# Patient Record
Sex: Male | Born: 1965 | Hispanic: Refuse to answer
Health system: Southern US, Community
[De-identification: ages and names within clinical notes are randomized; demographics above are authoritative.]

## PROBLEM LIST (undated history)

## (undated) DIAGNOSIS — G4730 Sleep apnea, unspecified: Secondary | ICD-10-CM

## (undated) HISTORY — DX: Sleep apnea, unspecified: G47.30

---

## 2008-03-02 ENCOUNTER — Encounter: Payer: Self-pay | Admitting: Pulmonary Disease

## 2008-03-16 ENCOUNTER — Ambulatory Visit: Payer: Self-pay | Admitting: Pulmonary Disease

## 2008-03-16 DIAGNOSIS — G4730 Sleep apnea, unspecified: Secondary | ICD-10-CM | POA: Insufficient documentation

## 2008-03-16 DIAGNOSIS — E669 Obesity, unspecified: Secondary | ICD-10-CM

## 2008-03-20 ENCOUNTER — Ambulatory Visit (HOSPITAL_BASED_OUTPATIENT_CLINIC_OR_DEPARTMENT_OTHER): Admission: RE | Admit: 2008-03-20 | Discharge: 2008-03-20 | Payer: Self-pay | Admitting: Pulmonary Disease

## 2008-03-20 ENCOUNTER — Encounter: Payer: Self-pay | Admitting: Pulmonary Disease

## 2008-03-21 ENCOUNTER — Ambulatory Visit: Payer: Self-pay | Admitting: Pulmonary Disease

## 2008-03-21 ENCOUNTER — Telehealth: Payer: Self-pay | Admitting: Pulmonary Disease

## 2008-03-27 ENCOUNTER — Encounter: Payer: Self-pay | Admitting: Pulmonary Disease

## 2008-04-09 ENCOUNTER — Encounter: Payer: Self-pay | Admitting: Pulmonary Disease

## 2008-04-24 ENCOUNTER — Ambulatory Visit: Payer: Self-pay | Admitting: Pulmonary Disease

## 2008-06-25 ENCOUNTER — Ambulatory Visit: Payer: Self-pay | Admitting: Pulmonary Disease

## 2008-10-02 ENCOUNTER — Encounter: Payer: Self-pay | Admitting: Pulmonary Disease

## 2008-10-08 ENCOUNTER — Telehealth: Payer: Self-pay | Admitting: Pulmonary Disease

## 2008-11-27 ENCOUNTER — Ambulatory Visit: Payer: Self-pay | Admitting: Pulmonary Disease

## 2009-12-16 ENCOUNTER — Ambulatory Visit: Payer: Self-pay | Admitting: Pulmonary Disease

## 2009-12-24 ENCOUNTER — Encounter: Payer: Self-pay | Admitting: Pulmonary Disease

## 2010-02-21 ENCOUNTER — Encounter: Payer: Self-pay | Admitting: Pulmonary Disease

## 2011-01-27 NOTE — Letter (Signed)
Summary: [facility] Results Follow Up Letter  [facility]  [address]   [place], [place] [address]   Phone: [phone]  Fax: [phone]    02/21/2010 MRN: [id number]  Julian Dixon [address] [place], [place]  [address]  Dear [name],  We have received the results from your recent tests and have been unable to contact you.  Please call our office at [phone] so that [facility] or his nurse may review the results with you.    Thank you,  [facility]  Appended Document: [facility] Results Follow Up Letter Letter mailed due to several attempts to contact pt via telephone. Need to inform of following- avg AHI 9/h, pl ask him to increase usage 4-6 hrs/ night. jwr

## 2011-02-11 ENCOUNTER — Ambulatory Visit: Payer: Self-pay | Admitting: Pulmonary Disease

## 2011-02-12 ENCOUNTER — Telehealth: Payer: Self-pay | Admitting: Pulmonary Disease

## 2011-02-16 ENCOUNTER — Encounter: Payer: Self-pay | Admitting: Pulmonary Disease

## 2011-02-16 ENCOUNTER — Ambulatory Visit (INDEPENDENT_AMBULATORY_CARE_PROVIDER_SITE_OTHER): Payer: Managed Care, Other (non HMO) | Admitting: Pulmonary Disease

## 2011-02-16 DIAGNOSIS — G4730 Sleep apnea, unspecified: Secondary | ICD-10-CM

## 2011-02-16 DIAGNOSIS — E669 Obesity, unspecified: Secondary | ICD-10-CM

## 2011-02-18 NOTE — Progress Notes (Signed)
Summary: nos appt  Phone Note Call from Patient   Caller: [email or web address]  Call For: Zebulon Gantt Summary of Call: Rsc nos from 2/16 to 2/20. Initial call taken by: Darletta Moll,  February 12, 2011 3:07 PM

## 2011-02-24 NOTE — Assessment & Plan Note (Signed)
Summary: wheezing//jd   Visit Type:  Follow-up Primary Provider/Referring Provider:  Dr. Lerry Liner ([facility])  CC:  Pt c/o wheezing x 2 1/2 wheezing all the time.  History of Present Illness: 44/M with severe obstructive sleep apnea for FU 4/09 >>Severe OSA AHI 83/h corrected by BiPAP +25/18 , full face mask. set up with BiPAP 04/09/08 at 23/18.  12/09>>mask ok, some dryness, energy levels 'great' , download 5/28- 9/28 >> need to improve compliance  December 16, 2009 2:54 PM  annual FU, some leak +, gained 20 lbs download 11/29-12/28/10 >> BiPAP 20/15, avg AHI 9/h, poor compliance  February 16, 2011 10:16 AM  Not helping, not using it right, he waits until asleep to put on machine WIfe now c/o wheezing on exertion & at rest indoors or outdoors, c/o excessive daytime somnolence  - has fallen asleep while drivnig , she 'has it on video' Has lost 18 lbs Spirometry shows nml ratio but FEv1 of 75%  - favor restriction rathe than obstruction   Preventive Screening-Counseling & Management  Alcohol-Tobacco     Smoking Status: never  Current Medications (verified): 1)  Bipap 23/18  Allergies (verified): No Known Drug Allergies  Past History:  Past Medical History: Last updated: 03/16/2008 Sleep Apnea  Social History: Last updated: 03/16/2008 divorced no children  lives alone  Review of Systems       The patient complains of dyspnea on exertion.  The patient denies anorexia, fever, weight loss, weight gain, vision loss, decreased hearing, hoarseness, chest pain, syncope, peripheral edema, prolonged cough, headaches, hemoptysis, abdominal pain, melena, hematochezia, severe indigestion/heartburn, hematuria, muscle weakness, suspicious skin lesions, transient blindness, difficulty walking, depression, unusual weight change, abnormal bleeding, enlarged lymph nodes, and angioedema.    Vital Signs:  Patient profile:   45 year old male Height:      74  inches Weight:      386.2 pounds BMI:     49.76 O2 Sat:      96 % on Room air Temp:     98.4 degrees F oral Pulse rate:   78 / minute BP sitting:   120 / 70  (left arm) Cuff size:   large  Vitals Entered By: Zackery Barefoot CMA (February 16, 2011 10:08 AM)  O2 Flow:  Room air CC: Pt c/o wheezing x 2 1/2 wheezing all the time Comments Medications reviewed with patient Verified contact number and pharmacy with patient Zackery Barefoot [facility]  February 16, 2011 10:08 AM    Physical Exam  Additional Exam:  386 February 16, 2011  Gen. Pleasant,obese, in no distress ENT - no lesions, no post nasal drip, class 3 airway Neck: No JVD, no thyromegaly, no carotid bruits Lungs: no use of accessory muscles, no dullness to percussion, clear without rales or rhonchi  Cardiovascular: Rhythm regular, heart sounds  normal, no murmurs or gallops, no peripheral edema Musculoskeletal: No deformities, no cyanosis or clubbing  Skin - no rash     Impression & Recommendations:  Problem # 1:  OBSTRUCTIVE SLEEP APNEA (ICD-780.57) Increase usage to 6 h/ night Rechk download , If needed will change to auto settings. On 20/15 right now, has changed DME ro [employer] Orders: Est. Patient Level IV ([address]) DME Referral (DME)  Problem # 2:  OBESITY (ICD-278.00) nutrition referral He may need gastric bypass Wife very concerned - he is in denial Spirometry  - doubt asthma, feel 'wheezing ' & dyspnea is also related to obesity Orders: Est. Patient Level IV (  [address]) DME Referral (DME) Nutrition Referral (Nutrition)  Patient Instructions: 1)  Copy sent to: 2)  Please schedule a follow-up appointment in 2 months. 3)  Incresae BiPAP usage to at least 6 h/ night , preferable whenever you sleep- send in the card in 1 month so we can decice about ned for auto settings 4)  Trial of albuterol MDI 2 puffs as needed - side efects - tremors or palpitations   Immunization History:  Influenza Immunization  History:    Influenza:  historical (12/02/2009)    Appended Document: Orders Update    Clinical Lists Changes  Orders: Added new Service order of Spirometry w/Graph (94010) - Signed

## 2011-03-01 ENCOUNTER — Emergency Department (HOSPITAL_COMMUNITY): Payer: Managed Care, Other (non HMO)

## 2011-03-01 ENCOUNTER — Emergency Department (HOSPITAL_COMMUNITY)
Admission: EM | Admit: 2011-03-01 | Discharge: 2011-03-01 | Disposition: A | Discharge status: Home / Self Care | Payer: Managed Care, Other (non HMO) | Attending: Emergency Medicine | Admitting: Emergency Medicine

## 2011-03-01 DIAGNOSIS — G4730 Sleep apnea, unspecified: Secondary | ICD-10-CM | POA: Insufficient documentation

## 2011-03-01 DIAGNOSIS — J329 Chronic sinusitis, unspecified: Secondary | ICD-10-CM | POA: Insufficient documentation

## 2011-03-01 DIAGNOSIS — R930 Abnormal findings on diagnostic imaging of skull and head, not elsewhere classified: Secondary | ICD-10-CM | POA: Insufficient documentation

## 2011-03-01 DIAGNOSIS — R51 Headache: Secondary | ICD-10-CM | POA: Insufficient documentation

## 2011-03-01 MED ORDER — IOHEXOL 350 MG/ML SOLN
100.0000 mL | Freq: Once | INTRAVENOUS | Status: AC | PRN
  Administered 2011-03-01: 100 mL via INTRAVENOUS

## 2011-04-16 ENCOUNTER — Encounter: Payer: Self-pay | Admitting: Pulmonary Disease

## 2011-04-20 ENCOUNTER — Ambulatory Visit: Payer: Managed Care, Other (non HMO) | Admitting: Pulmonary Disease

## 2011-05-12 NOTE — Procedures (Signed)
NAME:  Julian Dixon, Julian Dixon                 ACCOUNT NO.:  [id number]   MEDICAL RECORD NO.:  [id number]          PATIENT TYPE:  OUT   LOCATION:  SLEEP CENTER                 FACILITY[facility]  [facility]   PHYSICIAN:  Oretha Milch, MD      DATE OF BIRTH:  [birth year]   DATE OF STUDY:  03/20/2008                        MAINTENANCE OF WAKEFULNESS TEST   REFERRING PHYSICIAN:   INDICATION FOR STUDY:  Witnessed apneas, loud snoring and excessive  daytime somnolence in  this obese gentleman with a body mass index of  48, height 63 inches, weight 381 pounds, neck size 20 inches.   EPWORTH SLEEPINESS SCORE:  14/24.   This intervention polysomnogram was performed with a sleep technologist  in attendance.  EEG, EOG, EMG, EKG and respiratory data were recorded.  Sleep stages, arousals, limb movements and  respiratory parameters were  scored according to criteria laid out by the American Academy of Sleep  Medicine.   SLEEP ARCHITECTURE:  Lights out was at 2230 hours.  CPAP was initiated  at 0042 hours.  Lights on was at 0505 hours.  During the diagnostic  portion, total sleep time was 126.5 minutes with a sleep period time of  130.5 minutes and a sleep maintenance efficiency of 99.2%.  Sleep  latency was 1 minute.  No REM sleep was observed.  Sleep stages as a  percentage of total sleep time was N1 of 7.5% and N2 of 92.5%.  He slept  supine for the entire duration.   During the CPAP titration portion, REM sleep was observed for 54.5  minutes (21.8%).  The longest duration of the REM sleep was around 3:30  a.m.   AROUSAL DATA:  There were a total of 148 arousals with an arousal index  of 70.2 events per hour.  All of these were associated with respiratory  events during the baseline portion.  During the titration portion, 18  spontaneous arousals were also noted.   RESPIRATORY DISTURBANCE DATA:  During the baseline portion, there were a  total of 29 obstructive apneas and 146 hypopneas, leading to an  apnea/hypopnea index of 83 events per hour.  The longest hypopnea was  56.5 seconds and the longest apnea was 46.3 seconds.   Due to this degree of respiratory disturbance, CPAP was initiated at +5  cm and titrated to a level of +19 cm.  At this level, BiPAP was  initiated at +21/17 cm and titrated to +25/18 cm for comfort due to  snoring, respiratory events and arousal.   At CPAP +18 cm for 10 minutes, 5 hypopneas were noted with a  desaturation of 79% and an AHI of 30 events per hour.  At CPAP of +19  cm, 2 central apneas emerged and 3 hypopneas were noted.   At BiPAP level of +21/17 cm, 6 hypopneas were noted with a desaturation  of 84%.  Nine central apneas emerged at +23/17 cm.  At a final level of  +25/18 cm, there were 2 central apneas and zero hypopneas with the  lowest oxygen saturation of 92%.  This appears to be the optimal level.   OXYGEN SATURATION DATA:  The lowest  oxygen saturation during the  diagnostic portion was 51%.  He spent 55.6 minutes with a saturation  less than 88%.  The lowest desaturation at the final level of 25/18 was  92%.   LIMB MOVEMENT DATA:  No significant limb movements were noted.   CARDIAC DATA:  The lowest heart rate was 33 beats per minute during non-  REM sleep during the diagnostic portion.  No arrhythmias were noted.   DISCUSSION:  Few central apneas seemed to emerge during higher levels of  CPAP and BiPAP.  He seemed to tolerate the full face mask well.   IMPRESSION:  1. Severe obstructive sleep apneas and hypopneas, causing sleep      fragmentation and oxygen desaturation.  2. This was corrected with BiPAP of +25/18 cm although a few central      apneas were noted at the higher levels of BiPAP.  3. No evidence of cardiac arrhythmias, periodic limb movements or      behavioral disturbance during sleep.   RECOMMENDATIONS:  1. BiPAP will be initiated at +20/15 and titrated to a goal of +25/18      over a period of a few weeks.  2. We  will monitor symptomatic improvement and daytime sleepiness.  If      we do not get the desired improvement, we may have to repeat a      BiPAP titration study.  3. Compliance will be emphasized.  He should be cautioned about      driving when sleepy and has to avoid medications with sedative side      effects.      Oretha Milch, MD  Electronically Signed     RVA/MEDQ  D:  03/21/2008 13:12:29  T:  03/21/2008 19:24:59  Job:  [id number]   cc:   Dr. Lerry Liner

## 2011-09-30 IMAGING — CT CT ANGIO HEAD
2 of 8 series · 8 of 47 positions shown · IV contrast (APPLIED)
Comparison: CT head 03/01/2011

CLINICAL DATA: Headache.  Possible aneurysm on   head CT

CT ANGIOGRAPHY HEAD
TECHNIQUE: Multidetector CT imaging of the head was performed
using the standard protocol during bolus administration of
intravenous contrast.  Multiplanar CT image reconstructions
including MIPs were obtained to evaluate the vascular anatomy.
Contrast:  100 ml Omnipaque 350 IV

[Series 6: entire head 2.0 h20f · axial · 0.43mm/px · z∈[-144,-40]mm · 6 of 74 slices shown]
[im 11/74  brain]
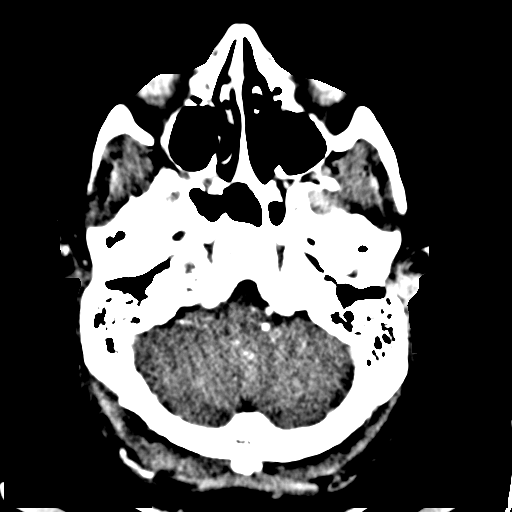
[im 21/74  bone]
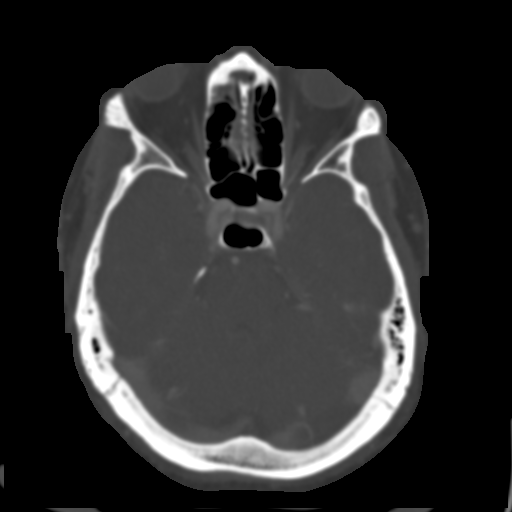
[im 32/74  brain]
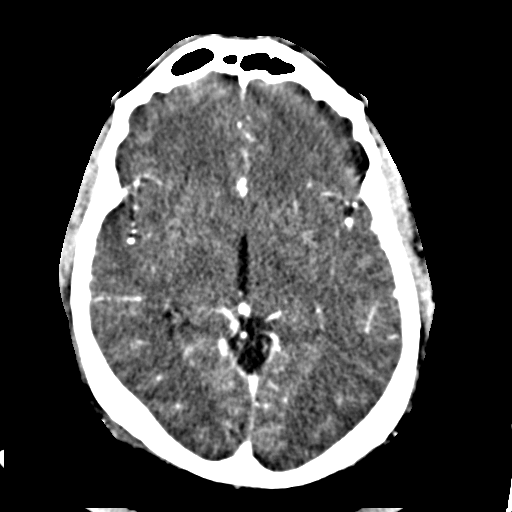
[im 42/74  bone]
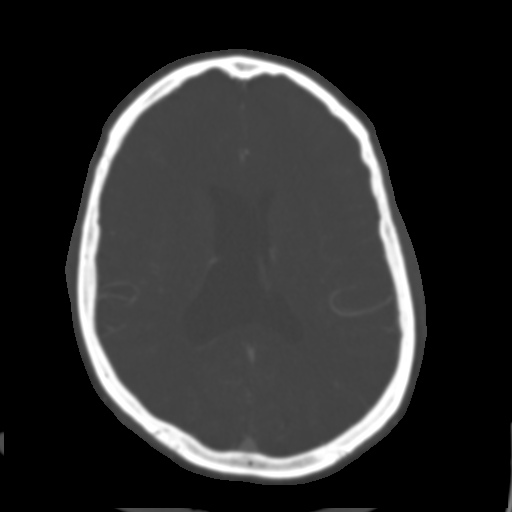
[im 53/74  brain]
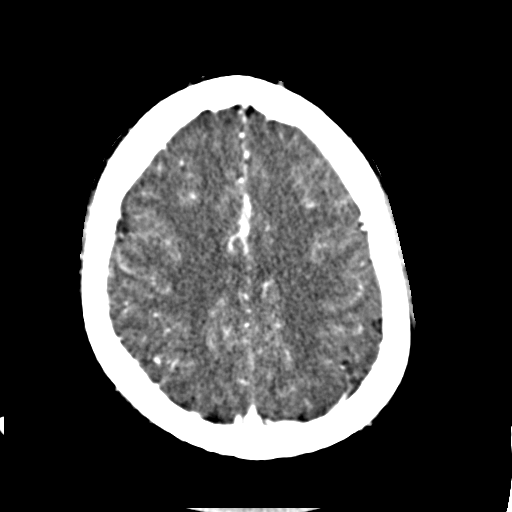
[im 63/74  bone]
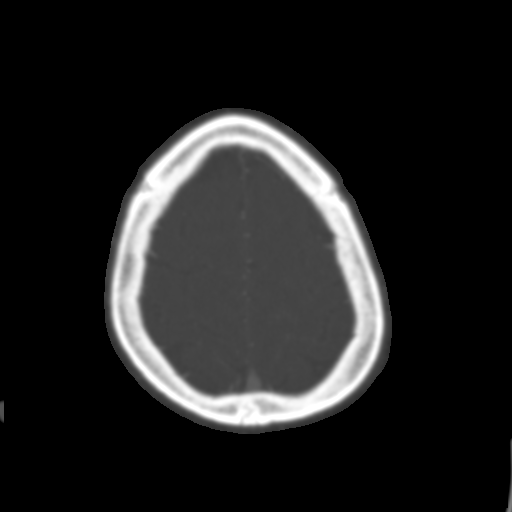

[Series 8: head w/cm 4.8 h45s · axial · 0.43mm/px · z∈[-130,-73]mm · 2 of 36 slices shown]
[im 12/36  brain]
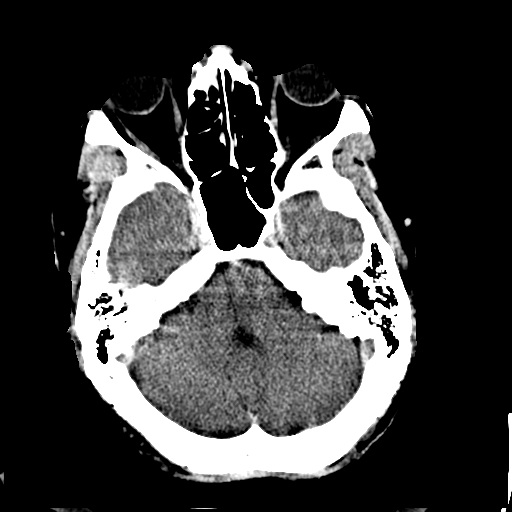
[im 24/36  brain]
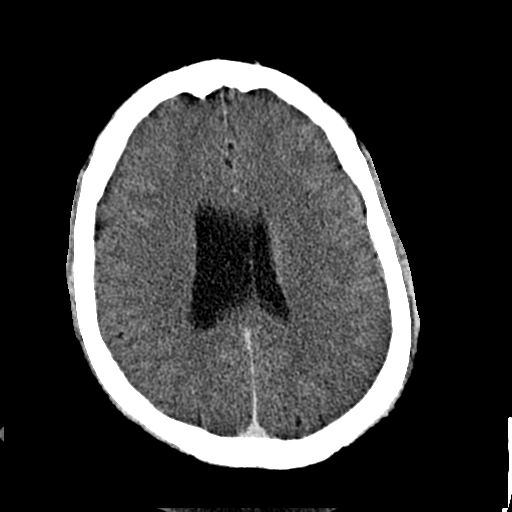

[8 of 47 positions shown; findings below may reference images not displayed]

FINDINGS: Asymmetric lateral ventricles with the right ventricle
larger than the left.  This is most likely a normal variant.  Left
ventricle is normal in size.  Third and fourth ventricles are
normal.  No infarct or mass.  No enhancing lesions are identified.

Both vertebral arteries are patent to the basilar without stenosis.
PICA, superior cerebellar, and posterior cerebral arteries are
patent bilaterally.  Fetal origin of the left posterior cerebral
artery with hypoplastic left P1 segment which is a normal variant.

Internal carotid artery is patent bilaterally without stenosis.
Anterior and middle cerebral arteries are patent bilaterally
without stenosis.

Negative for cerebral aneurysm.  Density in the region of the left
supraclinoid internal carotid on the left on the earlier head CT
appears to represent  vessel tortuosity and not an aneurysm.

 Chronic sinusitis is present.  Retention cyst in the maxillary
sinus bilaterally.

 Review of the MIP images confirms the above findings.
IMPRESSION: Normal CTA.  Negative for aneurysm.

Chronic sinusitis.

## 2013-05-04 ENCOUNTER — Ambulatory Visit: Payer: Managed Care, Other (non HMO) | Admitting: Pulmonary Disease

## 2013-05-16 ENCOUNTER — Ambulatory Visit: Payer: Managed Care, Other (non HMO) | Admitting: Pulmonary Disease

## 2014-04-20 ENCOUNTER — Telehealth: Payer: Self-pay | Admitting: Pulmonary Disease

## 2014-04-20 NOTE — Telephone Encounter (Signed)
lmomtcb x1 for pt Has not been seen since 2012 No samples available either

## 2014-04-20 NOTE — Telephone Encounter (Signed)
Called spoke with pt. Made him aware no samples. He will call his PCP for sample/refill.  Nothing further needed

## 2023-03-29 ENCOUNTER — Inpatient Hospital Stay
Admit: 2023-03-29 | Discharge: 2023-03-29 | Disposition: A | Discharge status: Home / Self Care | Payer: PRIVATE HEALTH INSURANCE | Attending: Emergency Medicine

## 2023-03-29 ENCOUNTER — Emergency Department: Admit: 2023-03-29 | Payer: PRIVATE HEALTH INSURANCE

## 2023-03-29 DIAGNOSIS — M25561 Pain in right knee: Secondary | ICD-10-CM

## 2023-03-29 MED ORDER — HYDROCODONE-ACETAMINOPHEN 5-325 MG PO TABS
5-325 | ORAL | Status: AC
Start: 2023-03-29 — End: 2023-03-29
  Administered 2023-03-29: 17:00:00 1 via ORAL

## 2023-03-29 MED ORDER — HYDROCODONE-ACETAMINOPHEN 5-325 MG PO TABS
5-325 MG | ORAL_TABLET | ORAL | 0 refills | Status: AC | PRN
Start: 2023-03-29 — End: 2023-04-01

## 2023-03-29 MED ORDER — PREDNISONE 20 MG PO TABS
20 | ORAL_TABLET | Freq: Two times a day (BID) | ORAL | 0 refills | Status: AC
Start: 2023-03-29 — End: 2023-04-03

## 2023-03-29 MED FILL — HYDROCODONE-ACETAMINOPHEN 5-325 MG PO TABS: 5-325 MG | ORAL | Qty: 1

## 2023-03-29 NOTE — ED Provider Notes (Addendum)
[facility] EM[facility]GENCY DEPT  EMERGENCY DEPARTMENT ENCOUNTER      Pt Name: Edward Logan  MRN: [id number]  [place] 05-Oct-1966  Date of evaluation: 03/29/2023  Provider: Naida Sleight, MD  1:20 PM    CHIEF COMPLAINT       Chief Complaint   Patient presents with    Knee Pain     Pt c/o right knee pain.  States feels like swelling.  Seen at [facility] this weekend states otc meds not helping. Denies injury.         HISTORY OF PRESENT ILLNESS    Edward Logan is a 57 y.o. male who presents to the emergency department presents for concerns of aching right-sided knee pain.  Medial.  Slightly swollen along this area.  No calf pain or unilateral leg swelling.  No focal deficit.  No injury that he knows of.  No fever or chills.  Went to [facility] emergency department and had presents for over-the-counter medications with no improvement.    HPI    Nursing Notes were reviewed.    REVIEW OF SYSTEMS       Review of Systems   All other systems reviewed and are negative.      Except as noted above the remainder of the review of systems was reviewed and negative.       PAST MEDICAL HISTORY   No past medical history on file.      SURGICAL HISTORY     No past surgical history on file.      CURRENT MEDICATIONS       Previous Medications    No medications on file       ALLERGIES     Patient has no known allergies.    FAMILY HISTORY     No family history on file.       SOCIAL HISTORY       Social History     Socioeconomic History    Marital status: Unknown       SCREENINGS         Glasgow Coma Scale  Eye Opening: Spontaneous  Best Verbal Response: Oriented  Best Motor Response: Obeys commands  Glasgow Coma Scale Score: 15                     CIWA Assessment  BP: 136/70  Pulse: 80                 PHYSICAL EXAM       ED Triage Vitals [03/29/23 1228]   BP Temp Temp Source Pulse Respirations SpO2 Height Weight - Scale   136/70 98.9 F (37.2 C) Oral 80 15 98 % 1.88 m (6\' 2" ) (!) 208.1 kg (458 lb 11.2 oz)       Physical Exam  Vitals and nursing note  reviewed.   Constitutional:       Appearance: Normal appearance. He is obese.   HENT:      Head: Normocephalic and atraumatic.      Right Ear: External ear normal.      Left Ear: External ear normal.      Nose: Nose normal.      Mouth/Throat:      Mouth: Mucous membranes are moist.      Pharynx: Oropharynx is clear.   Eyes:      Extraocular Movements: Extraocular movements intact.      Conjunctiva/sclera: Conjunctivae normal.      Pupils: Pupils are equal, round, and reactive  to light.   Cardiovascular:      Rate and Rhythm: Normal rate and regular rhythm.      Pulses: Normal pulses.   Pulmonary:      Effort: Pulmonary effort is normal. No respiratory distress.      Breath sounds: Normal breath sounds.   Musculoskeletal:         General: Tenderness present. Normal range of motion.      Cervical back: Normal range of motion and neck supple.      Comments: Tenderness to palpation to medial right knee, range of motion normal but with pain.  Extensive lower extremity adipose.   Skin:     General: Skin is warm.      Capillary Refill: Capillary refill takes less than 2 seconds.   Neurological:      General: No focal deficit present.      Mental Status: He is alert and oriented to person, place, and time. Mental status is at baseline.   Psychiatric:         Mood and Affect: Mood normal.         Behavior: Behavior normal.         Thought Content: Thought content normal.         DIAGNOSTIC RESULTS     EKG: All EKG's are interpreted by the Emergency Department Physician who either signs or Co-signs this chart in the absence of a cardiologist.        RADIOLOGY:   Non-plain film images such as CT, Ultrasound and MRI are read by the radiologist. 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 No knee joint effusion. No    acute fracture or dislocation. No focal osseous lesion.            ED BEDSIDE ULTRASOUND:   Performed by ED Physician - none    LABS:  Labs Reviewed - No data to display    All other labs were within normal range or not returned as of this dictation.    EMERGENCY DEPARTMENT COURSE and DIFFERENTIAL DIAGNOSIS/MDM:   Vitals:    Vitals:    03/29/23 1228   BP: 136/70   Pulse: 80   Resp: 15   Temp: 98.9 F (37.2 C)   TempSrc: Oral   SpO2: 98%   Weight: (!) 208.1 kg (458 lb 11.2 oz)   Height: 1.88 m (6\' 2" )           Medical Decision Making  57 year old male presents for concerns of medial left knee pain and tenderness.  This been occurring with increasing frequency and ambulation.  Patient is extremely overweight.  He has no calf pain or tenderness.  Neurovascularly intact with soft compartments.  X-ray with evidence of fairly severe arthritis.  Will start him on steroids, pain control and have him follow-up with orthopedics in the outpatient setting.    Amount and/or Complexity of Data Reviewed  Radiology: ordered.    Risk  Prescription drug management.            REASSESSMENT          CRITICAL CARE TIME       FINAL IMPRESSION      1.  Arthralgia of right knee          DISPOSITION/PLAN   DISPOSITION Decision To Discharge 03/29/2023 01:05:51 PM      PATIENT REFERRED TO:  Felipa Eth, MD  [address]  [address]  [place] [address]  [phone]            DISCHARGE MEDICATIONS:  New Prescriptions    HYDROCODONE-ACETAMINOPHEN (NORCO) 5-325 MG PER TABLET    Take 1 tablet by mouth every 4 hours as needed for Pain for up to 3 days. Intended supply: 3 days. Take lowest dose possible to manage pain Max Daily Amount: 6 tablets    PREDNISONE (DELTASONE) 20 MG TABLET    Take 1 tablet by mouth 2 times daily for 5 days     Controlled Substances Monitoring:          No data to display                (Please note that portions of this note were completed with a  voice recognition program.  Efforts were made to edit the dictations but occasionally words are mis-transcribed.)    Naida Sleight, MD (electronically signed)  Attending Emergency Physician            Makaiyah Schweiger, Darius Bump, MD  03/29/23 1318       Jermichael Belmares, Darius Bump, MD  03/29/23 1320

## 2023-03-30 ENCOUNTER — Inpatient Hospital Stay
Admit: 2023-03-30 | Discharge: 2023-03-31 | Disposition: A | Discharge status: Home / Self Care | Payer: PRIVATE HEALTH INSURANCE | Attending: Emergency Medicine

## 2023-03-30 DIAGNOSIS — M1711 Unilateral primary osteoarthritis, right knee: Secondary | ICD-10-CM

## 2023-03-30 MED ORDER — ACETAMINOPHEN 500 MG PO TABS
500 | ORAL | Status: AC
Start: 2023-03-30 — End: 2023-03-30
  Administered 2023-03-30: 21:00:00 1000 mg via ORAL

## 2023-03-30 MED FILL — TYLENOL EXTRA STRENGTH 500 MG PO TABS: 500 MG | ORAL | Qty: 2

## 2023-03-30 NOTE — ED Notes (Signed)
Pt supine in bed, resp even and unlabored, alert and oriented,pt denies further needs, pt tolerated oral meds, LL called for transport home, LL eta 2100

## 2023-03-30 NOTE — ED Notes (Signed)
Pt supine in bed, resp even and unlabored, alert and oriented, pt repositioned in bed, BP repositioned to get better reading, pt denies further needs waiting on further orders at this time

## 2023-03-30 NOTE — ED Notes (Signed)
LL called and made aware of pt non weight bearing status, pt states he Is unable to ambulate at this time

## 2023-03-30 NOTE — ED Provider Notes (Signed)
[facility]  EMERGENCY DEPARTMENT ENCOUNTER      Pt Name: Edward Logan  MRN: [id number]  Edward Logan [birth year]  Date of evaluation: 03/30/2023  Provider: Merlyn Lot, MD    CHIEF COMPLAINT       Chief Complaint   Patient presents with    Knee Pain     Pt was seen at [facility] yesterday and diagnosed with arthritis.pt is here today because he said he needs rehab for the knee         HISTORY OF PRESENT ILLNESS    HPI    Pt w/ morbid obesity and chronic R knee arthritis. Seen yesterday, xrays reviewed. He is in the process of getting PCP referral for PT. He has no fevers/chills/numbness weakness or tingling. Intact extensor function. 2+ pedal pulses, no acute reinjury or fall/twist since was evaluated for the same complaint yesterday.   Nursing Notes were reviewed.    REVIEW OF SYSTEMS       Review of Systems    Except as noted above the remainder of the review of systems was reviewed and negative.       PAST MEDICAL HISTORY   No past medical history on file.    SURGICAL HISTORY     No past surgical history on file.    CURRENT MEDICATIONS       Previous Medications    HYDROCODONE-ACETAMINOPHEN (NORCO) 5-325 MG PER TABLET    Take 1 tablet by mouth every 4 hours as needed for Pain for up to 3 days. Intended supply: 3 days. Take lowest dose possible to manage pain Max Daily Amount: 6 tablets    PREDNISONE (DELTASONE) 20 MG TABLET    Take 1 tablet by mouth 2 times daily for 5 days       ALLERGIES     Patient has no known allergies.    FAMILY HISTORY     No family history on file.     SOCIAL HISTORY       Social History     Socioeconomic History    Marital status: Unknown           PHYSICAL EXAM    (up to 7 for level 4, 8 or more for level 5)     ED Triage Vitals   BP Temp Temp Source Pulse Respirations SpO2 Height Weight - Scale   03/30/23 1600 03/30/23 1601 03/30/23 1601 03/30/23 1600 03/30/23 1600 03/30/23 1600 03/30/23 1600 03/30/23 1600   (!) 149/93 97.8 F (36.6 C) Oral 96 18 97 % 1.88 m (6' 2.02") (!) 207.7  kg (458 lb)       Physical Exam  Constitutional:       Appearance: Normal appearance. He is obese. He is not ill-appearing or diaphoretic.   HENT:      Nose: No congestion.   Cardiovascular:      Rate and Rhythm: Normal rate and regular rhythm.      Heart sounds: Normal heart sounds. No murmur heard.  Pulmonary:      Effort: Pulmonary effort is normal. No respiratory distress.      Breath sounds: Normal breath sounds. No wheezing.   Abdominal:      General: Abdomen is flat.      Palpations: Abdomen is soft.      Tenderness: There is no abdominal tenderness. There is no guarding.   Musculoskeletal:         General: No swelling, tenderness, deformity or signs of injury.  Comments: No focal osseous tenderness    Skin:     General: Skin is warm and dry.      Capillary Refill: Capillary refill takes less than 2 seconds.   Neurological:      General: No focal deficit present.      Mental Status: He is alert.      Sensory: No sensory deficit.      Motor: No weakness.         DIAGNOSTIC RESULTS     EKG: All EKG's are interpreted by the Emergency Department Physician who either signs or Co-signs this chart in the absence of a cardiologist.      RADIOLOGY:   Non-plain film images such as CT, Ultrasound and MRI are read by the radiologist. Plain radiographic images are visualized and preliminarily interpreted by the emergency physician with the below findings:        Interpretation per the Radiologist below, if available at the time of this note:    No orders to display         LABS:  Labs Reviewed - No data to display    All other labs were within normal range or not returned as of this dictation.    EMERGENCY DEPARTMENT COURSE/REASSESSMENT and MDM:   Vitals:    Vitals:    03/30/23 1920 03/30/23 1940 03/30/23 1945 03/30/23 2000   BP: (!) 186/116 (!) 184/86 120/67 (!) 141/86   Pulse:       Resp:       Temp:       TempSrc:       SpO2: 97% 97%  95%   Weight:       Height:           ED Course:       MDM    Pt w/ chronic pain  from OA in R knee    Obesity limits ability to get around to some extent    He needs strength training, will place home health PT/OT consult unless patient would prefer to go to PCP and get outpatient referral    Either way, no admission indicated. Seen yesterday and reviewed workup. RN staff have arranged for pt to go home via [place].       CONSULTS:  IP CONSULT TO HOME CARE NEEDS    PROCEDURES:  Unless otherwise noted below, none     Procedures        FINAL IMPRESSION      1. Osteoarthritis of right knee, unspecified osteoarthritis type          DISPOSITION/PLAN   DISPOSITION Decision To Discharge 03/30/2023 04:04:50 PM      PATIENT REFERRED TO:  call [phone] for a primary care doctor appointment          Schoderbek, Cherylynn Ridges., MD  [address], [address] [place] [address]  [phone]    Schedule an appointment as soon as possible for a visit in 1 week        DISCHARGE MEDICATIONS:  New Prescriptions    No medications on file     Controlled Substances Monitoring:          No data to display                (Please note that portions of this note were completed with a voice recognition program.  Efforts were made to edit the dictations but occasionally words are mis-transcribed.)  Merlyn Lot, MD (electronically signed)  Attending Emergency Physician           Merlyn Lot, MD  03/30/23 [phone]

## 2023-03-30 NOTE — ED Notes (Signed)
Knee immobilizer placed on pt, pt provided crutches at this time, waiting on LL

## 2023-03-30 NOTE — ED Notes (Signed)
Pt attempted to ambulated with walker, pt unable to ambulate with assistance, MD Raul Del made aware, MD Raul Del in contact with pt place of residency regarding pt being transport back to facility, consult for home health placed

## 2023-03-30 NOTE — ED Notes (Signed)
Pt brought in by EMS, pt needed 6 RN assists when transporting pt to stretcher. per EMS, pt needed 6 medics and fire crew assisting pt to the ambulance. Pt and EMS state that pt is unable to bare weight at this time. MD Raul Del at bedside to assess.

## 2023-03-30 NOTE — ED Notes (Signed)
Pt supine in bed, resp even and unlabored, alert and oriented, waiting on LL at this time, pt denies further needs at this time

## 2023-03-30 NOTE — ED Notes (Signed)
LL called or updated eta. Current Eta unknown at this time

## 2023-03-31 NOTE — Telephone Encounter (Signed)
Patient seen in ED for knee pain yesterday 4/2 and instructed to follow up with Dr. Estill Batten. Patient states he is in a lot of pain and would like to be seen asap. Please call patient to get scheduled. Patient also mentioned needing a referral for physical therapy.

## 2023-03-31 NOTE — Telephone Encounter (Signed)
Left another message for patient.

## 2023-03-31 NOTE — Telephone Encounter (Signed)
LM for patient to return my call.

## 2023-03-31 NOTE — Care Coordination-Inpatient (Signed)
03/31/23 0739   Service Assessment   Patient Orientation Unable to Assess   History Provided By Medical Record   Support Systems Other (Comment)  (Unknown / No one listed on facesheet)   PCP Verified by CM   (Unknown / None listed on facesheet)   Can patient return to prior living arrangement Yes   Family able to assist with home care needs: Other (comment)  (Unknown)   Financial Resources Other (Comment)  (Unknown / Listed as self-pay but need to verify)   CM/SW Referral Other (see comment)  (Home Health SN/PT/OT/MSW/HHA)   Social/Functional History   Ambulation Assistance Needs assistance   Transfer Assistance Needs assistance   Discharge Planning   Potential Assistance Needed Durable Medical Equipment;Home Care   Potential DME Needed Brace/Splint;Crutches   DME Ordered? Brace/Splint;Crutches  (Provided in the ED)   Type of Home Care Services OT;PT;Aide Services;Nursing Services  (Other: MSW)   Patient expects to be discharged to: [employer] At/After Discharge   Transition of Care Consult (CM Consult) Home Health   Internal Home Health Yes  [facility])   Services At/After Discharge DME;Home Health;Transport;In ambulance;OT;PT;Aide services;Nursing services  (Other: MSW)   Mode of Transport at Discharge BLS   Confirm Follow Up Transport Other (see comment)  ([facility])   Condition of Participation: Discharge Planning   The Plan for Transition of Care is related to the following treatment goals: Referred to [facility] for SN/PT/OT/MSW/HHA and [facility]     03/31/23  CSC:  Pt discharged from the ED last night.  MD ordered home health SN/PT/OT/MSW/HHA.  Pt is listed as self-pay and there is no PCP listed.  CM has tried calling the pt to get this information and to discuss home health, but CM had to leave a message requesting a call back.  CM has sent a referral to [facility] (in case pt is self-pay and needs charity services); [facility]elle notified of the referral.  CM has also  sent a referral to the [facility] in case pt is in need of a PCP.  CM will update note if/when pt calls back.  There are no other contacts or phone numbers on file.

## 2023-04-01 NOTE — Telephone Encounter (Addendum)
Please call pt back he did not receive any of your voicemails due to number being incorrect.     Please call [phone]

## 2023-04-01 NOTE — Telephone Encounter (Signed)
Spoke with patient and scheduled appt for tomorrow.  Patient states he will have to get an ambulance to bring him to the appointment.  He is interested in being sent to in-patient rehab but I told him that's typically arranged by PCP.  He states he does not currently have a PCP.

## 2023-04-02 ENCOUNTER — Encounter: Payer: PRIVATE HEALTH INSURANCE | Attending: Orthopaedic Surgery

## 2023-04-02 NOTE — Progress Notes (Signed)
A user error has taken place: encounter opened in error, closed for administrative reasons.

## 2023-04-02 NOTE — Telephone Encounter (Signed)
Patient asking if there are any medical transport companies that the office could recommend that might be able to bring him to his appointment today.

## 2023-04-02 NOTE — Telephone Encounter (Signed)
Spoke with patient and gave him a number for [facility] [phone]

## 2023-04-02 NOTE — Telephone Encounter (Signed)
Pt would like to know can he be given a referral to rehab. That is what he needs.
